# Patient Record
Sex: Male | Born: 2000 | Race: Black or African American | Hispanic: No | Marital: Single | Smoking: Never smoker
Health system: Southern US, Community
[De-identification: ages and names within clinical notes are randomized; demographics above are authoritative.]

## PROBLEM LIST (undated history)

## (undated) DIAGNOSIS — E039 Hypothyroidism, unspecified: Secondary | ICD-10-CM

## (undated) HISTORY — DX: Hypothyroidism, unspecified: E03.9

---

## 2020-12-09 ENCOUNTER — Other Ambulatory Visit: Payer: Self-pay

## 2020-12-09 ENCOUNTER — Emergency Department (HOSPITAL_COMMUNITY)
Admission: EM | Admit: 2020-12-09 | Discharge: 2020-12-10 | Disposition: A | Discharge status: Home / Self Care | Payer: BC Managed Care – PPO | Attending: Emergency Medicine | Admitting: Emergency Medicine

## 2020-12-09 ENCOUNTER — Encounter (HOSPITAL_COMMUNITY): Payer: Self-pay

## 2020-12-09 DIAGNOSIS — R000 Tachycardia, unspecified: Secondary | ICD-10-CM | POA: Insufficient documentation

## 2020-12-09 DIAGNOSIS — E860 Dehydration: Secondary | ICD-10-CM | POA: Diagnosis not present

## 2020-12-09 DIAGNOSIS — I10 Essential (primary) hypertension: Secondary | ICD-10-CM | POA: Diagnosis not present

## 2020-12-09 DIAGNOSIS — R569 Unspecified convulsions: Secondary | ICD-10-CM | POA: Diagnosis not present

## 2020-12-09 MED ORDER — SODIUM CHLORIDE 0.9 % IV BOLUS
1000.0000 mL | Freq: Once | INTRAVENOUS | Status: AC
  Administered 2020-12-10: 1000 mL via INTRAVENOUS

## 2020-12-09 NOTE — ED Triage Notes (Signed)
Pt BIB [facility] d/t 2 witnessed seizers, no Hx of them. EMS reports postictal upon their arrival, BP 160/90, 140 bpm, 91 % on RA, 18 resp, CBG 106. Upon arrival to ED pt is A.Ox4, verbal- able to make needs known, Pt reports having bit the Lt side of his tongue & has a cramp in his Rt thigh. Pt endorses no sleep since 1300 yesterday (12/08/20).

## 2020-12-10 ENCOUNTER — Emergency Department (HOSPITAL_COMMUNITY): Payer: BC Managed Care – PPO

## 2020-12-10 LAB — CBC WITH DIFFERENTIAL/PLATELET
Abs Immature Granulocytes: 0.03 10*3/uL (ref 0.00–0.07)
Basophils Absolute: 0 10*3/uL (ref 0.0–0.1)
Basophils Relative: 0 %
Eosinophils Absolute: 0 10*3/uL (ref 0.0–0.5)
Eosinophils Relative: 0 %
HCT: 44.2 % (ref 39.0–52.0)
Hemoglobin: 15 g/dL (ref 13.0–17.0)
Immature Granulocytes: 0 %
Lymphocytes Relative: 8 %
Lymphs Abs: 0.9 10*3/uL (ref 0.7–4.0)
MCH: 28.9 pg (ref 26.0–34.0)
MCHC: 33.9 g/dL (ref 30.0–36.0)
MCV: 85.2 fL (ref 80.0–100.0)
Monocytes Absolute: 0.6 10*3/uL (ref 0.1–1.0)
Monocytes Relative: 6 %
Neutro Abs: 9.3 10*3/uL — ABNORMAL HIGH (ref 1.7–7.7)
Neutrophils Relative %: 86 %
Platelets: 181 10*3/uL (ref 150–400)
RBC: 5.19 MIL/uL (ref 4.22–5.81)
RDW: 12.5 % (ref 11.5–15.5)
WBC: 10.8 10*3/uL — ABNORMAL HIGH (ref 4.0–10.5)
nRBC: 0 % (ref 0.0–0.2)

## 2020-12-10 LAB — BASIC METABOLIC PANEL
Anion gap: 6 (ref 5–15)
BUN: 12 mg/dL (ref 6–20)
CO2: 25 mmol/L (ref 22–32)
Calcium: 9.6 mg/dL (ref 8.9–10.3)
Chloride: 107 mmol/L (ref 98–111)
Creatinine, Ser: 1.29 mg/dL — ABNORMAL HIGH (ref 0.61–1.24)
GFR, Estimated: >60 mL/min (ref >60)
Glucose, Bld: 108 mg/dL — ABNORMAL HIGH (ref 70–99)
Potassium: 3.6 mmol/L (ref 3.5–5.1)
Sodium: 138 mmol/L (ref 135–145)

## 2020-12-10 MED ORDER — ACETAMINOPHEN 325 MG PO TABS
650.0000 mg | ORAL_TABLET | Freq: Once | ORAL | Status: DC
  Filled 2020-12-10: qty 2

## 2020-12-10 NOTE — ED Provider Notes (Signed)
[facility] EMERGENCY DEPARTMENT Provider Note   CSN: [id number] Arrival date & time:        History Chief Complaint  Patient presents with   Seizures    Mike Kelly is a 20 y.o. male.  HPI      Is a 20 year old male with no reported past medical history who presents following a seizure-like episode.  Patient provides history and additional history taken from EMS.  Patient states that he was dropping his girlfriend off at her house.  He remembers waving at her siblings.  The next thing he remembers is waking up to everyone asking him if he was okay.  He states it took him a little bit to realize what was going on and get up.  Patient has no history of seizures.  He did not have a prodrome.  He has not recently been ill.  He reports that he bit his tongue.  Patient denies any alcohol or drug use.  He does report that he has not slept in over 24 hours.  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  Per EMS, 2 witnesses reported seizure-like activity.  Upon EMS arrival, patient appeared postictal.  Vital signs notable for tachycardia and hypertension in route and CBG 106.  History reviewed. No pertinent past medical history.  There are no problems to display for this patient. No reported past medical history  History reviewed. No pertinent family history.     Home Medications Prior to Admission medications   Not on File    Allergies    Patient has no allergy information on record.  Review of Systems   Review of Systems  Constitutional:  Negative for fever.  Respiratory:  Negative for shortness of breath.   Cardiovascular:  Negative for chest pain.  Gastrointestinal:  Negative for abdominal pain, nausea and vomiting.  Genitourinary:  Negative for dysuria.  Neurological:  Positive for seizures. Negative for speech difficulty and headaches.  All other systems reviewed and are negative.  Physical Exam Updated Vital Signs BP 120/72    Pulse 70   Temp 98.1 F (36.7 C) (Oral)   Resp 18   Ht 1.93 m (6\' 4" )   Wt 122.5 kg   SpO2 100%   BMI 32.87 kg/m   Physical Exam Vitals and nursing note reviewed.  Constitutional:      Appearance: He is well-developed. He is obese. He is not ill-appearing.  HENT:     Head: Normocephalic and atraumatic.     Nose: Nose normal.     Mouth/Throat:     Mouth: Mucous membranes are moist.  Eyes:     Conjunctiva/sclera: Conjunctivae normal.     Pupils: Pupils are equal, round, and reactive to light.  Cardiovascular:     Rate and Rhythm: Regular rhythm. Tachycardia present.     Heart sounds: No murmur heard. Pulmonary:     Effort: Pulmonary effort is normal. No respiratory distress.     Breath sounds: Normal breath sounds.  Abdominal:     Palpations: Abdomen is soft.     Tenderness: There is no abdominal tenderness.  Musculoskeletal:     Cervical back: Neck supple.     Right lower leg: No edema.     Left lower leg: No edema.  Skin:    General: Skin is warm and dry.  Neurological:     Mental Status: He is alert.     Comments: Awake, alert, oriented, cranial nerves  II through XII intact, 5 out of 5 strength in all 4 extremities, no dysmetria to finger-nose-finger  Psychiatric:        Mood and Affect: Mood normal.    ED Results / Procedures / Treatments   Labs (all labs ordered are listed, but only abnormal results are displayed) Labs Reviewed  CBC WITH DIFFERENTIAL/PLATELET - Abnormal; Notable for the following components:      Result Value   WBC 10.8 (*)    Neutro Abs 9.3 (*)    All other components within normal limits  BASIC METABOLIC PANEL - Abnormal; Notable for the following components:   Glucose, Bld 108 (*)    Creatinine, Ser 1.29 (*)    All other components within normal limits    EKG EKG Interpretation  Date/Time:  Sunday December 10 2020 00:28:37 EDT Ventricular Rate:  89 PR Interval:  144 QRS Duration: 95 QT Interval:  337 QTC Calculation: 410 R  Axis:   42 Text Interpretation: Sinus rhythm Confirmed by Ross Marcus ([id number]) on 12/10/2020 1:05:42 AM  Radiology CT Head Wo Contrast  Result Date: 12/10/2020 CLINICAL DATA:  Nontraumatic seizures today EXAM: CT HEAD WITHOUT CONTRAST TECHNIQUE: Contiguous axial images were obtained from the base of the skull through the vertex without intravenous contrast. COMPARISON:  MRI brain 09/13/2016 FINDINGS: Brain: No evidence of acute infarction, hemorrhage, hydrocephalus, extra-axial collection or mass lesion/mass effect. Vascular: No hyperdense vessel or unexpected calcification. Skull: Calvarium appears intact. Sinuses/Orbits: Paranasal sinuses and mastoid air cells are clear. Other: None. IMPRESSION: No acute intracranial abnormalities. Electronically Signed   By: Burman Nieves M.D.   On: 12/10/2020 01:00    Procedures Procedures   Medications Ordered in ED Medications  acetaminophen (TYLENOL) tablet 650 mg (650 mg Oral Patient Refused/Not Given 12/10/20 0211)  sodium chloride 0.9 % bolus 1,000 mL (0 mLs Intravenous Stopped 12/10/20 0206)    ED Course  I have reviewed the triage vital signs and the nursing notes.  Pertinent labs & imaging results that were available during my care of the patient were reviewed by me and considered in my medical decision making (see chart for details).    MDM Rules/Calculators/A&P                          Patient presents with witnessed seizure-like activity.  Per EMS was postictal on scene and he bit his tongue.  He is overall nontoxic and vital signs notable for mild tachycardia.  History is highly suggestive of first-time seizure.  Syncope is also a consideration.  EKG without acute arrhythmic changes.  Slightly elevated to 1.29.  Patient was given fluids.  This could represent dehydration.  No other metabolic derangements.  CBC is reassuring.  Head CT is without acute intracranial abnormalities.  Discussed with the patient and his mother the likelihood  that this may have been a seizure.  He was instructed not to drive or operate heavy machinery until cleared by neurology.  Patient stated understanding.  No indication to start antiepileptics at this time as this is a first-time seizure.  After history, exam, and medical workup I feel the patient has been appropriately medically screened and is safe for discharge home. Pertinent diagnoses were discussed with the patient. Patient was given return precautions.  Final Clinical Impression(s) / ED Diagnoses Final diagnoses:  Seizure-like activity (HCC)  Dehydration    Rx / DC Orders ED Discharge Orders     None  Shon Baton, MD 12/10/20 [phone]

## 2020-12-10 NOTE — ED Notes (Signed)
Patient transported to CT 

## 2020-12-10 NOTE — Discharge Instructions (Addendum)
You were seen today for seizure-like activity.  You should not drive or operate heavy machinery until you follow-up with neurology for clearance.  Make sure that you are staying well-hydrated and getting good sleep as these things can lower your seizure threshold.

## 2020-12-11 ENCOUNTER — Encounter: Payer: Self-pay | Admitting: Neurology

## 2020-12-12 ENCOUNTER — Encounter: Payer: Self-pay | Admitting: Neurology

## 2020-12-12 ENCOUNTER — Ambulatory Visit (INDEPENDENT_AMBULATORY_CARE_PROVIDER_SITE_OTHER): Payer: Self-pay | Admitting: Neurology

## 2020-12-12 ENCOUNTER — Other Ambulatory Visit: Payer: Self-pay

## 2020-12-12 VITALS — BP 131/76 | HR 97 | Ht 73.0 in | Wt 283.0 lb

## 2020-12-12 DIAGNOSIS — R569 Unspecified convulsions: Secondary | ICD-10-CM

## 2020-12-12 NOTE — Patient Instructions (Signed)
Good to meet you!  Schedule MRI brain with and without contrast  2. Schedule 1-hour EEG  3. Follow-up 4-5 months, call for any changes   Seizure Precautions: 1. If medication has been prescribed for you to prevent seizures, take it exactly as directed.  Do not stop taking the medicine without talking to your doctor first, even if you have not had a seizure in a long time.   2. Avoid activities in which a seizure would cause danger to yourself or to others.  Don't operate dangerous machinery, swim alone, or climb in high or dangerous places, such as on ladders, roofs, or girders.  Do not drive unless your doctor says you may.  3. If you have any warning that you may have a seizure, lay down in a safe place where you can't hurt yourself.    4.  No driving for 6 months from last seizure, as per [facility].   Please refer to the following link on the [employer]ile[employer] of America's website for more information: http://www.epilepsyfoundation.org/answerplace/Social/driving/drivingu.cfm   5.  Maintain good sleep hygiene. Avoid alcohol.  6.  Contact your doctor if you have any problems that may be related to the medicine you are taking.  7.  Call 911 and bring the patient back to the ED if:        A.  The seizure lasts longer than 5 minutes.       B.  The patient doesn't awaken shortly after the seizure  C.  The patient has new problems such as difficulty seeing, speaking or moving  D.  The patient was injured during the seizure  E.  The patient has a temperature over 102 F (39C)  F.  The patient vomited and now is having trouble breathing

## 2020-12-12 NOTE — Progress Notes (Signed)
NEUROLOGY CONSULTATION NOTE  Mike Kelly MRN: [id number] DOB: Jul 17, 2000  Referring provider: Dr. Ross Marcus Primary care provider: Dr. Arlan Organ  Reason for consult:  seizure  Dear Dr Wilkie Aye:  Thank you for your kind referral of Mike Kelly for consultation of the above symptoms. Although his history is well known to you, please allow me to reiterate it for the purpose of our medical record. The patient was accompanied to the clinic by his mother Mike Kelly who also provides collateral information. Records and images were personally reviewed where available.   HISTORY OF PRESENT ILLNESS: This is a pleasant 20 year old left-handed man with a history of hypothyroidism due to Hashimoto's thyroiditis, presenting for evaluation of new onset seizure last 12/09/2020. ER notes were reviewed. He dropped off his girlfriend at her house and recalls talking to her family then waking up to everyone asking if he was okay. He was told he was doing something weird with his eyes, then started shaking and going down, hitting his head on the TV stand. He bit the left side of his tongue and blood was coming out of his mouth. He recalls waking up and feeling like everything was muted. He felt drowsy and sleepy, but recalls having pain in the right upper thigh, no focal weakness. There were no prior warning symptoms. He reported he had not slept in over 24 hours, working night shift. His mother reports he was "vaping like crazy," but he states this is not out of his normal, he has done this in the past with no issues. No alcohol use. Per EMS, he appeared post-ictal on their arrival, tachycardic and hypertensive en route. Bloodwork showed minimally elevated WBC 10.8 and creatinine 1.29. I personally reviewed head CT without contrast which did not show any acute changes.   He and his mother deny any staring/unresponsive episodes, gaps in time, olfactory/gustatory hallucinations, deja vu, rising epigastric  sensation, focal numbness/tingling/weakness, myoclonic jerks. He denies any headaches, dizziness, vision changes, neck/back pain, bowel/bladder dysfunction. The upper thighs still hurt, right more than left, but it is better. Memory is good. He had a normal birth and early development.  There is no history of febrile convulsions, CNS infections such as meningitis/encephalitis, significant traumatic brain injury, neurosurgical procedures, or family history of seizures.   PAST MEDICAL HISTORY: Past Medical History:  Diagnosis Date   Hypothyroidism     PAST SURGICAL HISTORY: History reviewed. No pertinent surgical history.  MEDICATIONS: Current Outpatient Medications on File Prior to Visit  Medication Sig Dispense Refill   albuterol (VENTOLIN HFA) 108 (90 Base) MCG/ACT inhaler TAKE 2 PUFFS BY MOUTH EVERY 4 HOURS AS NEEDED FOR WHEEZE (Patient not taking: Reported on 12/12/2020)     levothyroxine (SYNTHROID) 100 MCG tablet TAKE 1 TABLET (100 MCG TOTAL) BY MOUTH DAILY AT 6AM (Patient not taking: Reported on 12/12/2020)     metFORMIN (GLUCOPHAGE-XR) 500 MG 24 hr tablet 1 daily with supper for diabetes (Patient not taking: Reported on 12/12/2020)     No current facility-administered medications on file prior to visit.    ALLERGIES: No Known Allergies  FAMILY HISTORY: History reviewed. No pertinent family history.  SOCIAL HISTORY: Social History   Socioeconomic History   Marital status: Single    Spouse name: Not on file   Number of children: Not on file   Years of education: Not on file   Highest education level: Not on file  Occupational History   Not on file  Tobacco Use   Smoking  status: Not on file   Smokeless tobacco: Not on file  Substance and Sexual Activity   Alcohol use: Not on file   Drug use: Not on file   Sexual activity: Not on file  Other Topics Concern   Not on file  Social History Narrative   Not on file   Social Determinants of Health   Financial Resource  Strain: Not on file  Food Insecurity: Not on file  Transportation Needs: Not on file  Physical Activity: Not on file  Stress: Not on file  Social Connections: Not on file  Intimate Partner Violence: Not on file     PHYSICAL EXAM: Vitals:   12/12/20 1317  BP: 131/76  Pulse: 97  SpO2: 98%   General: No acute distress Head:  Normocephalic/atraumatic Skin/Extremities: No rash, no edema Neurological Exam: Mental status: alert and oriented to person, place, and time, no dysarthria or aphasia, Fund of knowledge is appropriate.  Recent and remote memory are intact, 2/3 delayed recall.  Attention and concentration are normal, 5/5 WORLD backwards.  Cranial nerves: CN I: not tested CN II: pupils equal, round and reactive to light, visual fields intact CN III, IV, VI:  full range of motion, no nystagmus, no ptosis CN V: facial sensation intact CN VII: upper and lower face symmetric CN VIII: hearing intact to conversation Bulk & Tone: normal, no fasciculations. Motor: 5/5 throughout with no pronator drift. Sensation: intact to light touch, cold, pin, vibration and joint position sense.  No extinction to double simultaneous stimulation.  Romberg test negative Deep Tendon Reflexes: +1 throughout Cerebellar: no incoordination on finger to nose testing Gait: narrow-based and steady, able to tandem walk adequately. Tremor: none   IMPRESSION: This is a pleasant 20 year old left-handed man with a history of hypothyroidism due to Hashimoto's thyroiditis, presenting for evaluation of new onset seizure last 12/09/2020 in the setting of sleep deprivation. His neurological exam is normal, no clear epilepsy risk factors. We discussed that after an initial seizure, unless there are significant risk factors, an abnormal neurological exam, an EEG showing epileptiform abnormalities, and/or abnormal neuroimaging, treatment with an antiepileptic drug is not indicated.  We discussed 10% of the population may  have a single seizure. Patients with a single unprovoked seizure have a recurrence rate of 33% after a single seizure and 73% after a second seizure. MRI brain with and without contrast and 1-hour EEG will be ordered. We discussed [place] driving restrictions which indicate a patient needs to free of seizures or events of altered awareness for 6 months prior to resuming driving. The patient agreed to comply with these restrictions.  We discussed avoidance of seizure triggers, including sleep deprivation, alcohol. Follow-up in 4 months, call for any changes.    Thank you for allowing me to participate in the care of this patient. Please do not hesitate to call for any questions or concerns.   Patrcia Dolly, M.D.  CC: Dr. Wilkie Aye, Dr. Martha Clan

## 2020-12-13 ENCOUNTER — Ambulatory Visit (INDEPENDENT_AMBULATORY_CARE_PROVIDER_SITE_OTHER): Payer: BC Managed Care – PPO | Admitting: Neurology

## 2020-12-13 DIAGNOSIS — R569 Unspecified convulsions: Secondary | ICD-10-CM | POA: Diagnosis not present

## 2020-12-19 NOTE — Procedures (Signed)
ELECTROENCEPHALOGRAM REPORT  Date of Study: 12/13/2020  Patient's Name: Mike Kelly MRN: [id number] Date of Birth: 19-Jan-2001  Referring Provider: Dr. Patrcia Dolly  Clinical History:  This is a 20 year old man with new onset seizure. EEG for classification.  Medications: Ventolin  Technical Summary: A multichannel digital 1-hour EEG recording measured by the international 10-20 system with electrodes applied with paste and impedances below 5000 ohms performed in our laboratory with EKG monitoring in an awake and asleep patient.  Hyperventilation was not performed. Photic stimulation was performed.  The digital EEG was referentially recorded, reformatted, and digitally filtered in a variety of bipolar and referential montages for optimal display.    Description: The patient is awake and asleep during the recording.  During maximal wakefulness, there is a symmetric, medium voltage 10 Hz posterior dominant rhythm that attenuates with eye opening.  The record is symmetric.  During drowsiness and sleep, there is an increase in theta slowing of the background.  Vertex waves and symmetric sleep spindles were seen.  Hyperventilation and photic stimulation did not elicit any abnormalities.  There were no epileptiform discharges or electrographic seizures seen.    EKG lead was unremarkable.  Impression: This 1-hour awake and asleep EEG is normal.    Clinical Correlation: A normal EEG does not exclude a clinical diagnosis of epilepsy.  If further clinical questions remain, prolonged EEG may be helpful.  Clinical correlation is advised.   Patrcia Dolly, M.D.

## 2020-12-28 ENCOUNTER — Telehealth: Payer: Self-pay | Admitting: Neurology

## 2020-12-28 NOTE — Telephone Encounter (Signed)
Pt's mother called in to get EEG results

## 2020-12-29 NOTE — Telephone Encounter (Signed)
Pls let them know EEG was normal, however it is not like a pregnancy test that is positive or negative, it is just a snapshot of his brain waves when we are doing the test. Proceed with brain MRI as discussed. Thanks

## 2020-12-29 NOTE — Telephone Encounter (Signed)
Pt mother called no answer left a voice mail to call the office back  

## 2021-01-01 NOTE — Telephone Encounter (Signed)
Spoke with pt mother informed her that EEG was normal, however it is not like a pregnancy test that is positive or negative, it is just a snapshot of his brain waves when we are doing the test. Proceed with brain MRI as discussed. She was given [facility]'s number to schedule MRI

## 2021-01-12 ENCOUNTER — Other Ambulatory Visit: Payer: Self-pay

## 2021-01-12 ENCOUNTER — Ambulatory Visit
Admission: RE | Admit: 2021-01-12 | Discharge: 2021-01-12 | Disposition: A | Discharge status: Home / Self Care | Payer: BC Managed Care – PPO | Source: Ambulatory Visit | Attending: Neurology | Admitting: Neurology

## 2021-01-12 MED ORDER — GADOBENATE DIMEGLUMINE 529 MG/ML IV SOLN
20.0000 mL | Freq: Once | INTRAVENOUS | Status: AC | PRN
  Administered 2021-01-12: 20 mL via INTRAVENOUS

## 2021-01-26 ENCOUNTER — Ambulatory Visit: Payer: Self-pay | Admitting: Neurology

## 2021-02-01 ENCOUNTER — Ambulatory Visit: Payer: Self-pay | Admitting: Neurology

## 2021-04-13 ENCOUNTER — Other Ambulatory Visit: Payer: Self-pay

## 2021-04-13 ENCOUNTER — Encounter: Payer: Self-pay | Admitting: Neurology

## 2021-04-13 ENCOUNTER — Ambulatory Visit (INDEPENDENT_AMBULATORY_CARE_PROVIDER_SITE_OTHER): Payer: BC Managed Care – PPO | Admitting: Neurology

## 2021-04-13 VITALS — BP 137/79 | HR 62 | Ht 76.0 in | Wt 293.8 lb

## 2021-04-13 DIAGNOSIS — R569 Unspecified convulsions: Secondary | ICD-10-CM | POA: Diagnosis not present

## 2021-04-13 NOTE — Patient Instructions (Signed)
Good to see you doing well. Follow-up in 6 months, call for any changes. ? ? ?Seizure Precautions: ?1. If medication has been prescribed for you to prevent seizures, take it exactly as directed.  Do not stop taking the medicine without talking to your doctor first, even if you have not had a seizure in a long time.  ? ?2. Avoid activities in which a seizure would cause danger to yourself or to others.  Don't operate dangerous machinery, swim alone, or climb in high or dangerous places, such as on ladders, roofs, or girders.  Do not drive unless your doctor says you may. ? ?3. If you have any warning that you may have a seizure, lay down in a safe place where you can't hurt yourself.   ? ?4.  No driving for 6 months from last seizure, as per [place] state law.   Please refer to the following link on the [employer]ile[employer] Foundation of America's website for more information: http://www.epilepsyfoundation.org/answerplace/Social/driving/drivingu.cfm  ? ?5.  Maintain good sleep hygiene. Avoid alcohol. ? ?6.  Contact your doctor if you have any problems that may be related to the medicine you are taking. ? ?7.  Call 911 and bring the patient back to the ED if: ?      ? A.  The seizure lasts longer than 5 minutes.      ? B.  The patient doesn't awaken shortly after the seizure ? C.  The patient has new problems such as difficulty seeing, speaking or moving ? D.  The patient was injured during the seizure ? E.  The patient has a temperature over 102 F (39C) ? F.  The patient vomited and now is having trouble breathing ?      ? ?

## 2021-04-13 NOTE — Progress Notes (Signed)
NEUROLOGY FOLLOW UP OFFICE NOTE  Mike Kelly [id number] 2001-04-26  HISTORY OF PRESENT ILLNESS: I had the pleasure of seeing Mike Kelly in follow-up in the neurology clinic on 04/13/2021.  The patient was last seen 4 months ago for new onset seizure that occurred 12/09/2020. He is alone in the office today. Records and images were personally reviewed where available.  I personally reviewed MRI brain with and without contrast done 12/2020 which was normal, hippocampi symmetric with no abnormal signal or enhancement seen. His 1-hour wake and sleep EEG in 11/2020 was normal. He has been doing well with no further seizures or seizure-like symptoms since 12/09/20. He denies any staring/unresponsive episodes, gaps in time, olfactory/gustatory hallucinations, focal numbness/tingling/weakness, myoclonic jerks. No headaches, dizziness, no falls. He has been sleeping better and stopped vaping, he feels physically better.    History on Initial Assessment 12/12/2020: This is a pleasant 20 year old left-handed man with a history of hypothyroidism due to Hashimoto's thyroiditis, presenting for evaluation of new onset seizure last 12/09/2020. ER notes were reviewed. He dropped off his girlfriend at her house and recalls talking to her family then waking up to everyone asking if he was okay. He was told he was doing something weird with his eyes, then started shaking and going down, hitting his head on the TV stand. He bit the left side of his tongue and blood was coming out of his mouth. He recalls waking up and feeling like everything was muted. He felt drowsy and sleepy, but recalls having pain in the right upper thigh, no focal weakness. There were no prior warning symptoms. He reported he had not slept in over 24 hours, working night shift. His mother reports he was "vaping like crazy," but he states this is not out of his normal, he has done this in the past with no issues. No alcohol use. Per EMS, he appeared  post-ictal on their arrival, tachycardic and hypertensive en route. Bloodwork showed minimally elevated WBC 10.8 and creatinine 1.29. I personally reviewed head CT without contrast which did not show any acute changes.   He and his mother deny any staring/unresponsive episodes, gaps in time, olfactory/gustatory hallucinations, deja vu, rising epigastric sensation, focal numbness/tingling/weakness, myoclonic jerks. He denies any headaches, dizziness, vision changes, neck/back pain, bowel/bladder dysfunction. The upper thighs still hurt, right more than left, but it is better. Memory is good. He had a normal birth and early development.  There is no history of febrile convulsions, CNS infections such as meningitis/encephalitis, significant traumatic brain injury, neurosurgical procedures, or family history of seizures.   PAST MEDICAL HISTORY: Past Medical History:  Diagnosis Date   Hypothyroidism     MEDICATIONS: Current Outpatient Medications on File Prior to Visit  Medication Sig Dispense Refill   albuterol (VENTOLIN HFA) 108 (90 Base) MCG/ACT inhaler      levothyroxine (SYNTHROID) 100 MCG tablet      metFORMIN (GLUCOPHAGE-XR) 500 MG 24 hr tablet      No current facility-administered medications on file prior to visit.    ALLERGIES: No Known Allergies  FAMILY HISTORY: History reviewed. No pertinent family history.  SOCIAL HISTORY: Social History   Socioeconomic History   Marital status: Single    Spouse name: Not on file   Number of children: Not on file   Years of education: Not on file   Highest education level: Not on file  Occupational History   Not on file  Tobacco Use   Smoking status: Never  Smokeless tobacco: Never  Vaping Use   Vaping Use: Never used  Substance and Sexual Activity   Alcohol use: Never   Drug use: Never   Sexual activity: Not on file  Other Topics Concern   Not on file  Social History Narrative   Left handed   Social Determinants of Health    Financial Resource Strain: Not on file  Food Insecurity: Not on file  Transportation Needs: Not on file  Physical Activity: Not on file  Stress: Not on file  Social Connections: Not on file  Intimate Partner Violence: Not on file     PHYSICAL EXAM: Vitals:   04/13/21 1442  BP: 137/79  Pulse: 62  SpO2: 97%   General: No acute distress Head:  Normocephalic/atraumatic Skin/Extremities: No rash, no edema Neurological Exam: alert and awake. No aphasia or dysarthria. Fund of knowledge is appropriate.  Recent and remote memory are intact.  Attention and concentration are normal.   Cranial nerves: Pupils equal, round. Extraocular movements intact with no nystagmus. Visual fields full.  No facial asymmetry.  Motor: Bulk and tone normal, muscle strength 5/5 throughout with no pronator drift.   Finger to nose testing intact.  Gait narrow-based and steady, able to tandem walk adequately.  Romberg negative.   IMPRESSION: This is a pleasant 20 yo LH man with a history of hypothyroidism due to Hashimoto's thyroiditis, with new onset seizure last 12/09/20 in the setting of sleep deprivation. Neurological exam normal, MRI brain and 1-hour EEG normal. He denies any further seizures or seizure-like symptoms since 11/2020. We again discussed that after an initial seizure, unless there are significant risk factors, an abnormal neurological exam, an EEG showing epileptiform abnormalities, and/or abnormal neuroimaging, treatment with an antiepileptic drug is not indicated. We discussed avoidance of seizure triggers. He is aware of [place] driving laws to stop driving until 6 months seizure-free. He will follow-up in 6 months but knows to call for any change in symptoms.    Thank you for allowing me to participate in his care.  Please do not hesitate to call for any questions or concerns.    Patrcia Dolly, M.D.   CC: Dr. Martha Clan

## 2021-10-30 ENCOUNTER — Encounter: Payer: Self-pay | Admitting: Neurology

## 2021-10-30 ENCOUNTER — Ambulatory Visit: Payer: BC Managed Care – PPO | Admitting: Neurology

## 2021-10-30 VITALS — BP 122/74 | HR 71 | Ht 76.0 in | Wt 300.0 lb

## 2021-10-30 DIAGNOSIS — R569 Unspecified convulsions: Secondary | ICD-10-CM

## 2021-10-30 NOTE — Progress Notes (Signed)
? ?NEUROLOGY FOLLOW UP OFFICE NOTE ? ?Mike Kelly ?[id number] ?2001/05/05 ? ?HISTORY OF PRESENT ILLNESS: ?I had the pleasure of seeing Mike Kelly in follow-up in the neurology clinic on 10/30/2021.  The patient was last seen 6 months ago for new onset seizure that occurred 12/09/2020. He is alone in the office today. MRI brain and EEG were normal. He continues to do well with no seizures or seizure-like symptoms since 11/2020. He denies any staring/unresponsive episodes, gaps in time, olfactory/gustatory hallucinations, deja vu, focal numbness/tingling/weakness, myoclonic jerks. He had one incident of headache last night with pulsing just on the right temple radiating to his right eye. It would come and go every 10-30 minutes. He was working all night and the pulsing stopped since 7mn. Otherwise he denies any other significant headaches, dizziness, vision changes, no falls. He is sleeping better. He has started working 3rd shift but gets enough sleep. Mood is good.  ? ? ?History on Initial Assessment 12/12/2020: This is a pleasant 21 year old left-handed man with a history of hypothyroidism due to Hashimoto's thyroiditis, presenting for evaluation of new onset seizure last 12/09/2020. ER notes were reviewed. He dropped off his girlfriend at her house and recalls talking to her family then waking up to everyone asking if he was okay. He was told he was doing something weird with his eyes, then started shaking and going down, hitting his head on the TV stand. He bit the left side of his tongue and blood was coming out of his mouth. He recalls waking up and feeling like everything was muted. He felt drowsy and sleepy, but recalls having pain in the right upper thigh, no focal weakness. There were no prior warning symptoms. He reported he had not slept in over 24 hours, working night shift. His mother reports he was "vaping like crazy," but he states this is not out of his normal, he has done this in the past with no issues.  No alcohol use. Per EMS, he appeared post-ictal on their arrival, tachycardic and hypertensive en route. Bloodwork showed minimally elevated WBC 10.8 and creatinine 1.29. I personally reviewed head CT without contrast which did not show any acute changes.  ? ?He and his mother deny any staring/unresponsive episodes, gaps in time, olfactory/gustatory hallucinations, deja vu, rising epigastric sensation, focal numbness/tingling/weakness, myoclonic jerks. He denies any headaches, dizziness, vision changes, neck/back pain, bowel/bladder dysfunction. The upper thighs still hurt, right more than left, but it is better. Memory is good. He had a normal birth and early development.  There is no history of febrile convulsions, CNS infections such as meningitis/encephalitis, significant traumatic brain injury, neurosurgical procedures, or family history of seizures. ? ?Diagnostic Data: ?MRI brain with and without contrast done 12/2020 which was normal, hippocampi symmetric with no abnormal signal or enhancement seen. ? ?1-hour wake and sleep EEG in 11/2020 was normal ? ? ?PAST MEDICAL HISTORY: ?Past Medical History:  ?Diagnosis Date  ? Hypothyroidism   ? ? ?MEDICATIONS: ?Current Outpatient Medications on File Prior to Visit  ?Medication Sig Dispense Refill  ? albuterol (VENTOLIN HFA) 108 (90 Base) MCG/ACT inhaler     ? levothyroxine (SYNTHROID) 100 MCG tablet     ? metFORMIN (GLUCOPHAGE-XR) 500 MG 24 hr tablet     ? ?No current facility-administered medications on file prior to visit.  ? ? ?ALLERGIES: ?No Known Allergies ? ?FAMILY HISTORY: ?History reviewed. No pertinent family history. ? ?SOCIAL HISTORY: ?Social History  ? ?Socioeconomic History  ? Marital status: Single  ?  Spouse name: Not on file  ? Number of children: Not on file  ? Years of education: Not on file  ? Highest education level: Not on file  ?Occupational History  ? Not on file  ?Tobacco Use  ? Smoking status: Never  ? Smokeless tobacco: Never  ?Vaping Use  ?  Vaping Use: Never used  ?Substance and Sexual Activity  ? Alcohol use: Never  ? Drug use: Never  ? Sexual activity: Not on file  ?Other Topics Concern  ? Not on file  ?Social History Narrative  ? Left handed  ? ?Social Determinants of Health  ? ?Financial Resource Strain: Not on file  ?Food Insecurity: Not on file  ?Transportation Needs: Not on file  ?Physical Activity: Not on file  ?Stress: Not on file  ?Social Connections: Not on file  ?Intimate Partner Violence: Not on file  ? ? ? ?PHYSICAL EXAM: ?Vitals:  ? 10/30/21 1503  ?BP: 122/74  ?Pulse: 71  ?SpO2: 97%  ? ?General: No acute distress ?Head:  Normocephalic/atraumatic ?Skin/Extremities: No rash, no edema ?Neurological Exam: alert and awake. No aphasia or dysarthria. Fund of knowledge is appropriate. Attention and concentration are normal.   Cranial nerves: Pupils equal, round. Extraocular movements intact with no nystagmus. Visual fields full.  No facial asymmetry.  Motor: Bulk and tone normal, muscle strength 5/5 throughout with no pronator drift.   Finger to nose testing intact.  Gait narrow-based and steady, able to tandem walk adequately.  Romberg negative. ? ? ?IMPRESSION: ?This is a pleasant [name] with a history of hypothyroidism due to Hashimoto's thyroiditis, with new onset seizure last 12/09/20 in the setting of sleep deprivation. Neurological exam normal, MRI brain and 1-hour EEG normal. He continues to do well seizure-free since 11/2020. He is not on seizure medication, we had discussed that after an initial seizure, unless there are significant risk factors, an abnormal neurological exam, an EEG showing epileptiform abnormalities, and/or abnormal neuroimaging, treatment with an antiepileptic drug is not indicated. We again discussed avoidance of seizure triggers, including missing medication, sleep deprivation, alcohol. He is aware of [place] driving laws to stop driving after a seizure until 6 months seizure-free. He will follow-up in 1 year and  knows to call for any changes.  ? ?Thank you for allowing me to participate in his care.  Please do not hesitate to call for any questions or concerns. ? ? ? ?Patrcia Dolly, M.D. ? ? ?CC: Dr. Martha Clan ? ? ? ?

## 2021-10-30 NOTE — Patient Instructions (Signed)
Good to see you. Follow-up in 1 year, call for any changes. ? ?Seizure Precautions: ?1. If medication has been prescribed for you to prevent seizures, take it exactly as directed.  Do not stop taking the medicine without talking to your doctor first, even if you have not had a seizure in a long time.  ? ?2. Avoid activities in which a seizure would cause danger to yourself or to others.  Don't operate dangerous machinery, swim alone, or climb in high or dangerous places, such as on ladders, roofs, or girders.  Do not drive unless your doctor says you may. ? ?3. If you have any warning that you may have a seizure, lay down in a safe place where you can't hurt yourself.   ? ?4.  No driving for 6 months from last seizure, as per [facility].   Please refer to the following link on the [employer] website for more information: http://www.epilepsyfoundation.org/[email or web address]/driving/drivingu.cfm  ? ?5.  Maintain good sleep hygiene. Avoid alcohol. ? ?6.  Contact your doctor if you have any problems that may be related to the medicine you are taking. ? ?7.  Call 911 and bring the patient back to the ED if: ?      ? A.  The seizure lasts longer than 5 minutes.      ? B.  The patient doesn't awaken shortly after the seizure ? C.  The patient has new problems such as difficulty seeing, speaking or moving ? D.  The patient was injured during the seizure ? E.  The patient has a temperature over 102 F (39C) ? F.  The patient vomited and now is having trouble breathing ?      ? ?

## 2022-11-01 ENCOUNTER — Encounter: Payer: Self-pay | Admitting: Neurology

## 2022-11-01 ENCOUNTER — Ambulatory Visit: Payer: Self-pay | Admitting: Neurology

## 2022-11-01 DIAGNOSIS — Z029 Encounter for administrative examinations, unspecified: Secondary | ICD-10-CM

## 2023-05-15 IMAGING — CT CT HEAD W/O CM
4 series · 15 of 47 positions shown, 17 images · non-contrast
Comparison: MRI brain 09/13/2016

CLINICAL DATA: Nontraumatic seizures today

EXAM:
CT HEAD WITHOUT CONTRAST
TECHNIQUE: Contiguous axial images were obtained from the base of the skull
through the vertex without intravenous contrast.

[Series 3: head without · axial · non-contrast · 0.47mm/px · z∈[-79,+36]mm · 7 of 31 slices shown, 9 images]
[im 4/31  brain]
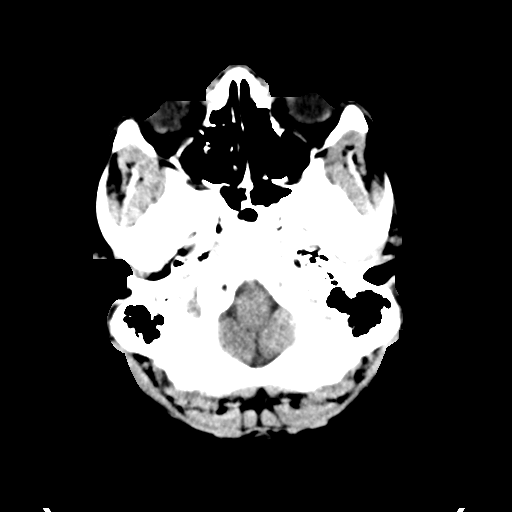
[im 4/31  bone]
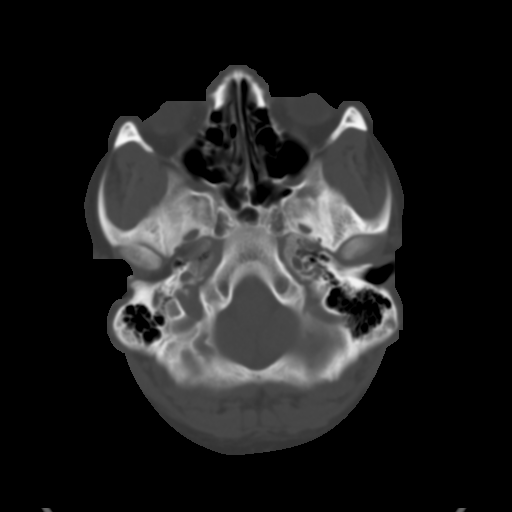
[im 8/31  brain]
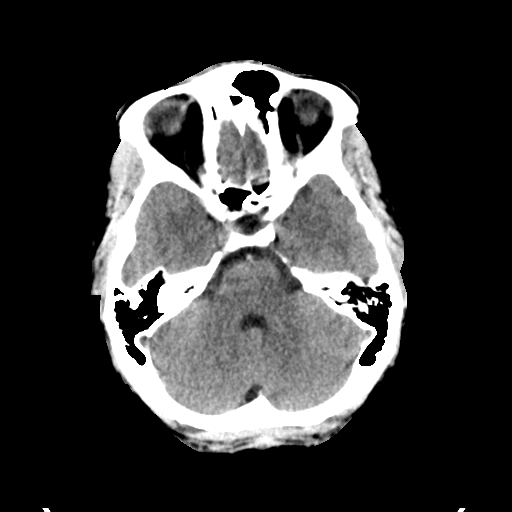
[im 12/31  brain]
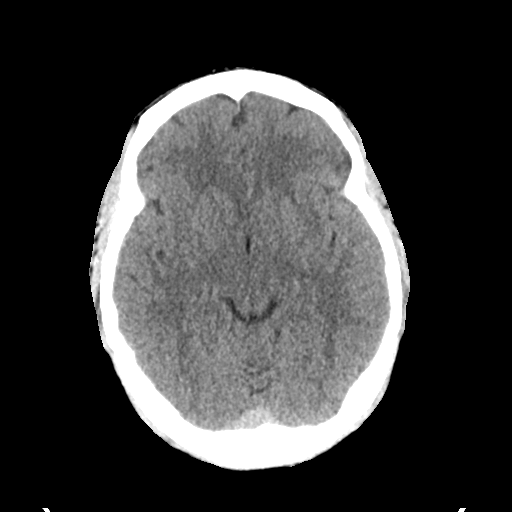
[im 16/31  brain]
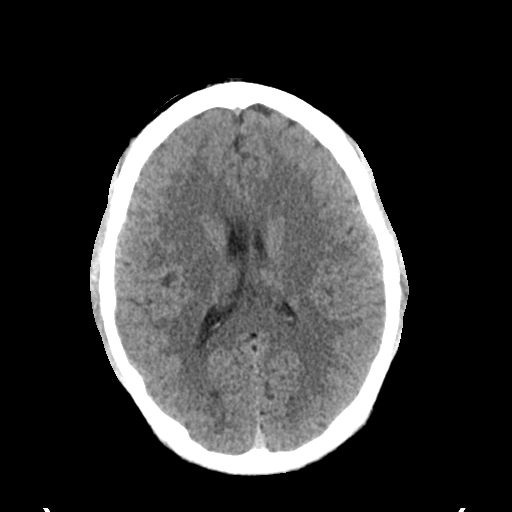
[im 19/31  brain]
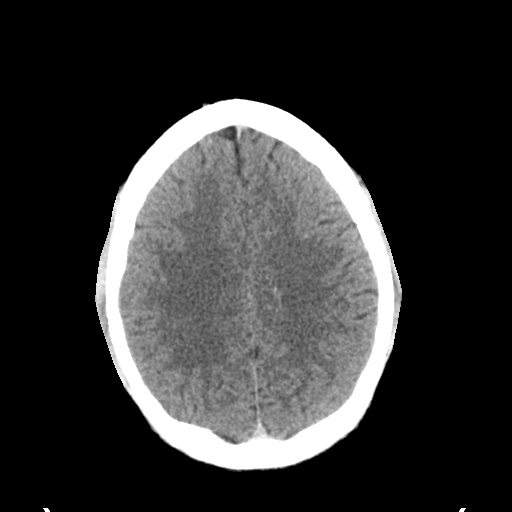
[im 19/31  bone]
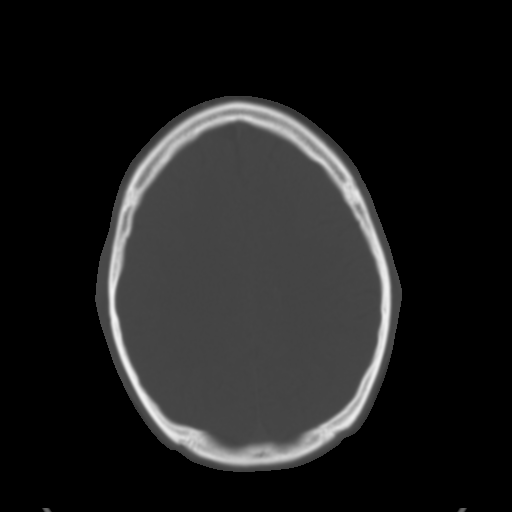
[im 23/31  brain]
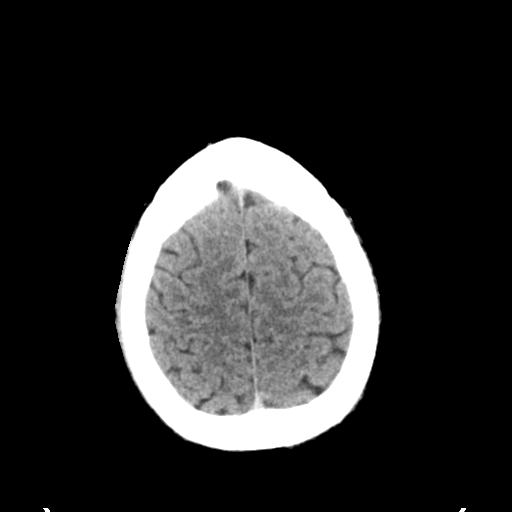
[im 27/31  brain]
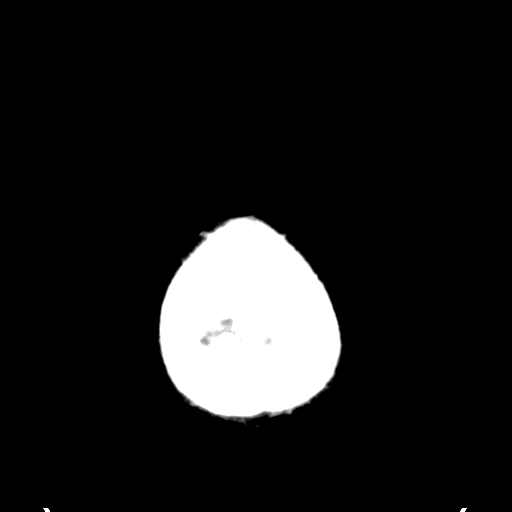

[Series 4: head bone · axial · 0.47mm/px · z∈[-80,-64]mm · 2 of 78 slices shown]
[im 8/78  bone]
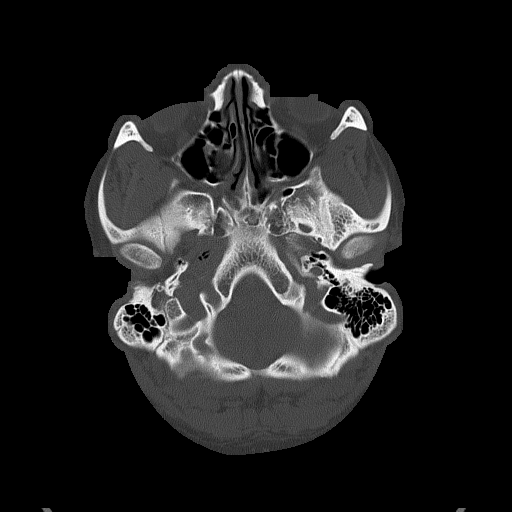
[im 16/78  bone]
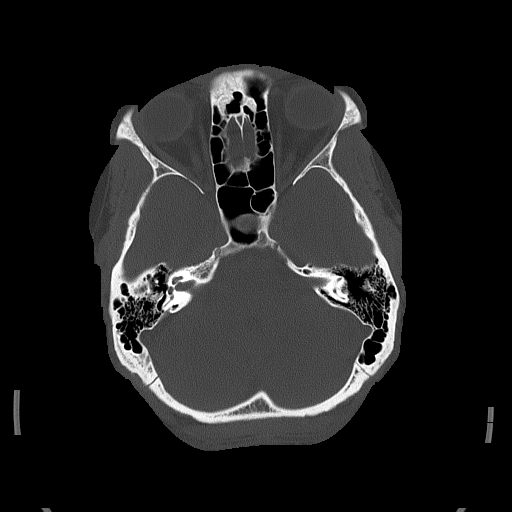

[Series 5: head without cor · coronal · non-contrast · 0.30mm/px · 3 of 66 slices shown]
[im 22/66  brain]
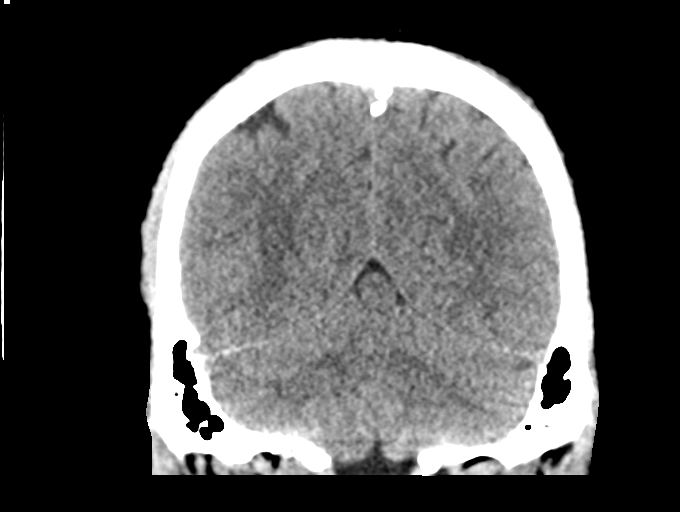
[im 29/66  brain]
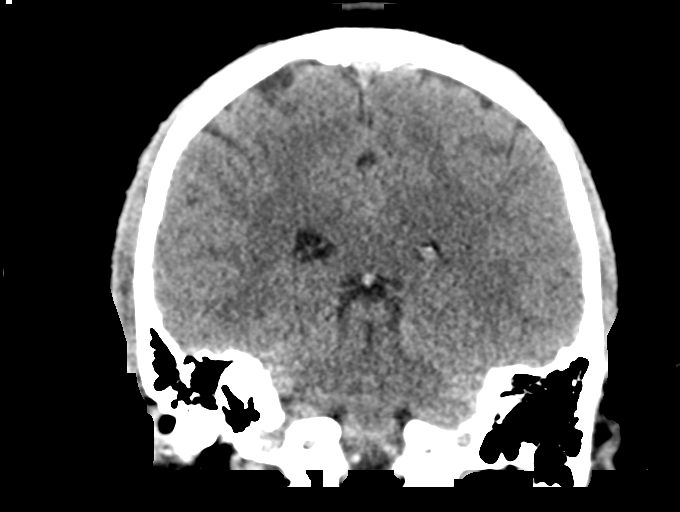
[im 37/66  brain]
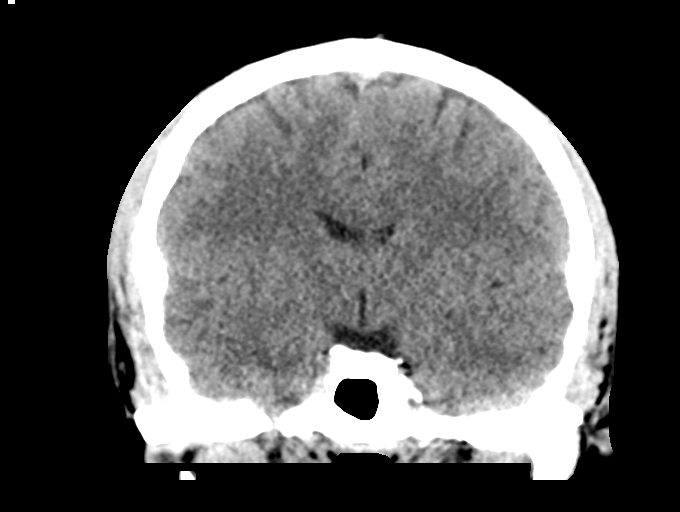

[Series 6: head without sag · sagittal · non-contrast · 0.31mm/px · 3 of 58 slices shown]
[im 20/58  brain]
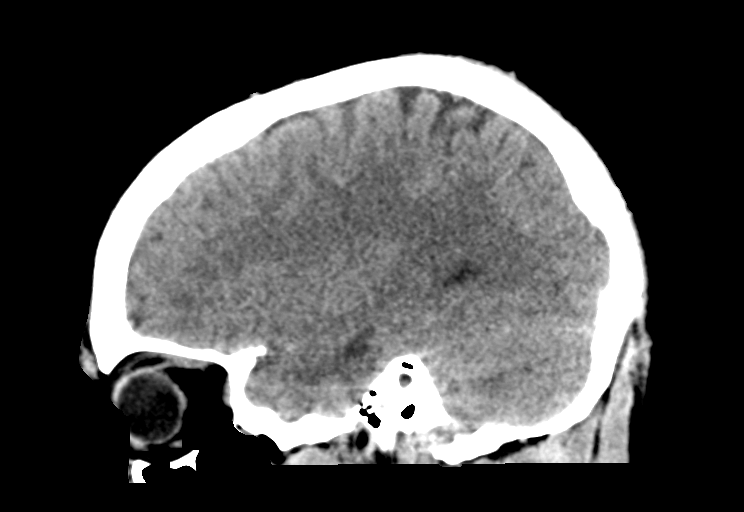
[im 29/58  brain]
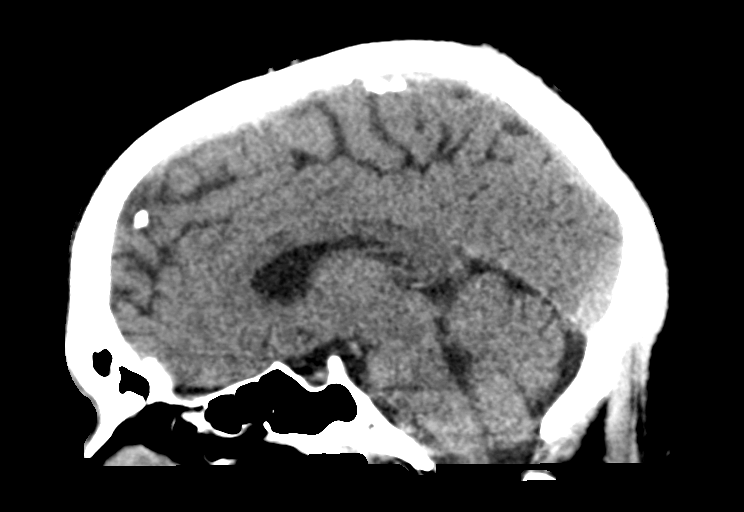
[im 39/58  brain]
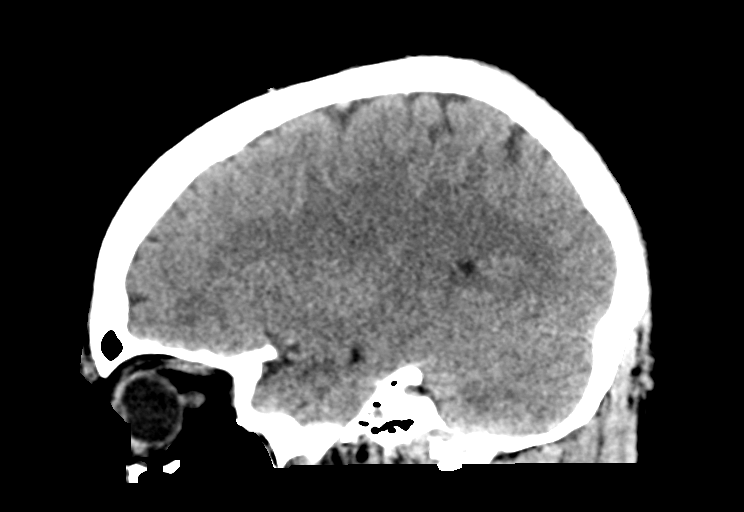

[15 of 47 positions shown; findings below may reference images not displayed]

FINDINGS: Brain: No evidence of acute infarction, hemorrhage, hydrocephalus,
extra-axial collection or mass lesion/mass effect.

Vascular: No hyperdense vessel or unexpected calcification.

Skull: Calvarium appears intact.

Sinuses/Orbits: Paranasal sinuses and mastoid air cells are clear.

Other: None.
IMPRESSION: No acute intracranial abnormalities.
